# Patient Record
Sex: Male | Born: 1985 | Race: Black or African American | Hispanic: No | Marital: Single | Smoking: Current every day smoker
Health system: Southern US, Community
[De-identification: ages and names within clinical notes are randomized; demographics above are authoritative.]

## PROBLEM LIST (undated history)

## (undated) DIAGNOSIS — L309 Dermatitis, unspecified: Secondary | ICD-10-CM

## (undated) HISTORY — DX: Dermatitis, unspecified: L30.9

---
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
## 2019-01-20 ENCOUNTER — Encounter: Payer: Self-pay | Admitting: Family Medicine

## 2019-01-20 ENCOUNTER — Other Ambulatory Visit: Payer: Self-pay

## 2019-01-20 ENCOUNTER — Ambulatory Visit (INDEPENDENT_AMBULATORY_CARE_PROVIDER_SITE_OTHER): Payer: Self-pay | Admitting: Family Medicine

## 2019-01-20 ENCOUNTER — Encounter (INDEPENDENT_AMBULATORY_CARE_PROVIDER_SITE_OTHER): Payer: Self-pay

## 2019-01-20 VITALS — BP 138/98 | HR 85 | Temp 98.7°F | Resp 15 | Ht 74.0 in | Wt 237.1 lb

## 2019-01-20 DIAGNOSIS — I10 Essential (primary) hypertension: Secondary | ICD-10-CM

## 2019-01-20 DIAGNOSIS — E669 Obesity, unspecified: Secondary | ICD-10-CM

## 2019-01-20 DIAGNOSIS — Z1159 Encounter for screening for other viral diseases: Secondary | ICD-10-CM

## 2019-01-20 DIAGNOSIS — Z114 Encounter for screening for human immunodeficiency virus [HIV]: Secondary | ICD-10-CM

## 2019-01-20 DIAGNOSIS — Z833 Family history of diabetes mellitus: Secondary | ICD-10-CM

## 2019-01-20 DIAGNOSIS — Z9189 Other specified personal risk factors, not elsewhere classified: Secondary | ICD-10-CM

## 2019-01-20 DIAGNOSIS — L209 Atopic dermatitis, unspecified: Secondary | ICD-10-CM

## 2019-01-20 MED ORDER — TACROLIMUS 0.1 % EX OINT: TOPICAL_OINTMENT | Freq: Two times a day (BID) | CUTANEOUS | 0 refills | Status: DC

## 2019-01-20 MED ORDER — PROTOPIC 0.1 % EX OINT: TOPICAL_OINTMENT | Freq: Two times a day (BID) | CUTANEOUS | 0 refills | Status: DC

## 2019-01-20 NOTE — Progress Notes (Signed)
Subjective:     Patient ID: Vincent Arellano, male   DOB: [birth year], 33 y.o.   MRN: [id number]  Vincent Arellano presents for Establish Care and Eczema (has eczema on his arms and neck area that has been flaring up)  Vincent Arellano is a 33 year old male patient who presents today to establish care.  Has been in prison for the last 12 years.  Was released on April 14. Family history is consistent for diabetes, kidney disease, hypertension.  Reports that he is an every day smoker right now.  About a quarter of a pack of cigarettes.  Denies having any vape use.  Reports that he does smoke alcohol.  About 3-5 shots in the weekend of liquor.  Reports that he smokes marijuana about a month or so a week.  Reports that he is sexually active only uses condoms at the time.  Is not in a monogamous relationship at this time.  Is unsure of his current STI status.  Reports that he has had HIV testing before and was negative.  Would like to have STI testing again.  Denies having any signs or symptoms of STI at this time that he knows of.  Socially he lives with his mother and father.  They have no pets.  He has a daughter who is 312 But he is good with coparenting with the mother.  Enjoys music and chilling out and working out.  Reports that he eats meat but does not eat pork.  Does enjoy fruits and veggies.  Could eat a little bit better per his report.  Does report drinking red bull energy drinks occasionally throughout the week.  But not on a consistent basis.  He tries to drink about half a gallon or more of water daily.  Wears a seatbelt does not wear sunscreen.  Has smoke and carbon monoxide detectors in the home.  Does not use phone while driving.  Allergies to penicillin.  Only known medical history is eczema.  He reports that it started flaring up a couple weeks back.  At the end of his elbows and around his neck.  This is what he used to do when he was a young child.  He used to use a cream that he  does not remember the name of.  But would like to try to get on a cream again.  As he thinks this is unsightly and would like to get rid of it.  Today patient denies signs and symptoms of COVID 19 infection including fever, chills, cough, shortness of breath, and headache.  Past Medical, Surgical, Social History, Allergies, and Medications have been Reviewed.   Past Medical History:  Diagnosis Date  . Eczema    History reviewed. No pertinent surgical history. Social History   Socioeconomic History  . Marital status: Single    Spouse name: Not on file  . Number of children: 1  . Years of education: Not on file  . Highest education level: 9th grade  Occupational History  . Not on file  Social Needs  . Financial resource strain: Not hard at all  . Food insecurity    Worry: Never true    Inability: Never true  . Transportation needs    Medical: No    Non-medical: No  Tobacco Use  . Smoking status: Current Every Day Smoker    Packs/day: 0.25    Types: Cigarettes  . Smokeless tobacco: Never Used  Substance and Sexual Activity  .  Alcohol use: Yes    Comment: weekends 5 shots   . Drug use: Yes    Frequency: 1.0 times per week    Types: Marijuana  . Sexual activity: Yes    Birth control/protection: Condom  Lifestyle  . Physical activity    Days per week: 7 days    Minutes per session: 30 min  . Stress: Not at all  Relationships  . Social connections    Talks on phone: More than three times a week    Gets together: More than three times a week    Attends religious service: Never    Active member of club or organization: No    Attends meetings of clubs or organizations: Never    Relationship status: Never married  . Intimate partner violence    Fear of current or ex partner: No    Emotionally abused: No    Physically abused: No    Forced sexual activity: No  Other Topics Concern  . Not on file  Social History Narrative   Lives with mom and pops   No pets     Daughter 32 [address] -co-parenting with mom      Enjoy music, looking for work, chilling and hanging out      Diet: eats meat, no pork, fruits, veggies    Caffeine: red bull energy drinks occasionally   Water: 1/2 gallon or more daily       Wear seat belt   No sunscreen   Smoke and carbon monoxide detectors at home   Does not use phone while driving    No outpatient encounter medications on file as of 01/20/2019.   No facility-administered encounter medications on file as of 01/20/2019.    Allergies  Allergen Reactions  . Penicillins     Review of Systems  Constitutional: Negative for chills and fever.  HENT: Negative.   Eyes: Negative for visual disturbance.  Respiratory: Negative for cough and shortness of breath.   Gastrointestinal: Negative.   Genitourinary: Negative.   Musculoskeletal: Negative.   Skin: Positive for rash.  Neurological: Negative for dizziness and headaches.  Hematological: Negative.   Psychiatric/Behavioral: Negative.   All other systems reviewed and are negative.      Objective:     BP (!) 140/100 (BP Location: Left Arm, Patient Position: Sitting)   Pulse 85   Temp 98.7 F (37.1 C) (Temporal)   Resp 15   Ht 6\' 2"  (1.88 m)   Wt 237 lb 1.9 oz (107.6 kg)   SpO2 96%   BMI 30.44 kg/m   Physical Exam Vitals signs and nursing note reviewed.  Constitutional:      Appearance: Normal appearance.  HENT:     Head: Normocephalic and atraumatic.     Right Ear: External ear normal.     Left Ear: External ear normal.     Nose: Nose normal.  Eyes:     General:        Right eye: No discharge.        Left eye: No discharge.     Conjunctiva/sclera: Conjunctivae normal.  Neck:     Musculoskeletal: Normal range of motion and neck supple.  Cardiovascular:     Rate and Rhythm: Normal rate and regular rhythm.     Pulses: Normal pulses.     Heart sounds: Normal heart sounds.  Pulmonary:     Effort: Pulmonary effort is normal.     Breath sounds:  Normal breath sounds.  Musculoskeletal: Normal  range of motion.  Skin:    General: Skin is warm.     Capillary Refill: Capillary refill takes less than 2 seconds.     Comments: Neck and elbows    Neurological:     Mental Status: He is alert and oriented to person, place, and time.  Psychiatric:        Mood and Affect: Mood normal.        Behavior: Behavior normal.        Thought Content: Thought content normal.        Judgment: Judgment normal.        Assessment and Plan        1. Atopic dermatitis, unspecified type Had eczema has a child. Current rash is consistent with this. Will try Protopic.   Reviewed side effects, risks and benefits of medication.   Patient acknowledged agreement and understanding of the plan.   - tacrolimus (PROTOPIC) 0.1 % ointment; Apply topically 2 (two) times daily.  Dispense: 100 g; Refill: 0  2. Essential hypertension Needs baseline labs. Blood pressure is elevated. Would like 2 months to get it in back in control. Educated on lifestyle changes and diet. Pending the level that he is that when he returns in 2 months we will be deciding on whether or not he needs to go on a medication.  He is aware of this.  - CBC - COMPLETE METABOLIC PANEL WITH GFR  3. Encounter for hepatitis C virus screening test for high risk patient [place] task force recommendation  - HEP C AB W/REFL  4. Family history of diabetes mellitus  - Hemoglobin A1c  5. Encounter for screening for HIV [place] task force recommendation   - HIV Antibody (routine testing w rflx)  6. Obesity (BMI 30-39.9) Most likely BMI is due to muscle mass. Reports not eating the best. Will get baseline.    - Lipid panel   Follow Up: 2 months    Freddy FinnerHannah M. Traveion Ruddock, DNP, AGNP-BC [facility] [address], [address] [place], [place] [address] Office Hours: Mon-Thurs 8 am-5 pm; Fri 8 am-12 pm Office Phone:  [phone]  Office Fax: [phone]

## 2019-01-20 NOTE — Patient Instructions (Signed)
Thank you for coming into the office today. I appreciate the opportunity to provide you with the care for your health and wellness. Today we discussed: overall health  Follow Up: 2 months for blood pressure check  Labs on Friday morning (dont eat before you get them) I will let you know the results when they come in.  Sign up with MyChart  Avoid the bulk building pill until you know it does not cause blood pressure.  I sent in the cream for your skin. Take a directed, let me know if it does not work.  Work on reducing the about of salt you have in your diet.  Think about stopping smoking. One less cigarette a day.  Please continue to practice social distancing to keep you, your family, and our community safe.  If you must go out, please wear a Mask and practice good handwashing.  WASH YOUR HANDS WELL AND FREQUENTLY. AVOID TOUCHING YOUR FACE, UNLESS YOUR HANDS ARE FRESHLY WASHED.  GET FRESH AIR DAILY. STAY HYDRATED WITH WATER.   It was a pleasure to see you and I look forward to continuing to work together on your health and well-being. Please do not hesitate to call the office if you need care or have questions about your care.  Have a wonderful day and week.  With Gratitude,  Tereasa CoopHannah Audria Takeshita, DNP, AGNP-BC   Please think about quitting smoking.  This is very important for your health.  Consider setting a quit date, then cutting back or switching brands to prepare to stop.  Also think of the money you will save every day by not smoking.  Quick Tips to Quit Smoking:  Fix a date i.e. keep a date in mind from when you would not touch a tobacco product to smoke   Keep yourself busy and block your mind with work loads or reading books or watching movies in malls where smoking is not allowed   Vanish off the things which reminds you about smoking for example match box, or your favorite lighter, or the pipe you used for smoking, or your favorite jeans and shirt with which you  used to enjoy smoking, or the club where you used to do smoking   Try to avoid certain people places and incidences where and with whom smoking is a common factor to add on   Praise yourself with some token gifts from the money you saved by stopping smoking   Anti Smoking teams are there to help you. Join their programs   Anti-smoking Gums are there in many medical shops. Try them to quit smoking   Side-effects of Smoking:  Disease caused by smoking cigarettes are emphysema, bronchitis, heart failures   Premature death   Cancer is the major side effect of smoking   Heart attacks and strokes are the quick effects of smoking causing sudden death   Some smokers lives end up with limbs amputated   Breathing problem or fast breathing is another side effect of smoking   Due to more intakes of smokes, carbon mono-oxide goes into your brain and other muscles of the body which leads to swelling of the veins and blockage to the air passage to lungs   Carbon monoxide blocks blood vessels which leads to blockage in the flow of blood to different major body organs like heart lungs and thus leads to attacks and deaths   During pregnancy smoking is very harmful and leads to premature birth of the infant, spontaneous abortions,  low weight of the infant during birth   Fat depositions to narrow and blocked blood vessels causing heart attacks   In many cases cigarette smoking caused infertility in men    DASH Eating Plan DASH stands for "Dietary Approaches to Stop Hypertension." The DASH eating plan is a healthy eating plan that has been shown to reduce high blood pressure (hypertension). It may also reduce your risk for type 2 diabetes, heart disease, and stroke. The DASH eating plan may also help with weight loss. What are tips for following this plan?  General guidelines  Avoid eating more than 2,300 mg (milligrams) of salt (sodium) a day. If you have hypertension, you may need to reduce  your sodium intake to 1,500 mg a day.  Limit alcohol intake to no more than 1 drink a day for nonpregnant women and 2 drinks a day for men. One drink equals 12 oz of beer, 5 oz of wine, or 1 oz of hard liquor.  Work with your health care provider to maintain a healthy body weight or to lose weight. Ask what an ideal weight is for you.  Get at least 30 minutes of exercise that causes your heart to beat faster (aerobic exercise) most days of the week. Activities may include walking, swimming, or biking.  Work with your health care provider or diet and nutrition specialist (dietitian) to adjust your eating plan to your individual calorie needs. Reading food labels   Check food labels for the amount of sodium per serving. Choose foods with less than 5 percent of the Daily Value of sodium. Generally, foods with less than 300 mg of sodium per serving fit into this eating plan.  To find whole grains, look for the word "whole" as the first word in the ingredient list. Shopping  Buy products labeled as "low-sodium" or "no salt added."  Buy fresh foods. Avoid canned foods and premade or frozen meals. Cooking  Avoid adding salt when cooking. Use salt-free seasonings or herbs instead of table salt or sea salt. Check with your health care provider or pharmacist before using salt substitutes.  Do not fry foods. Cook foods using healthy methods such as baking, boiling, grilling, and broiling instead.  Cook with heart-healthy oils, such as olive, canola, soybean, or sunflower oil. Meal planning  Eat a balanced diet that includes: ? 5 or more servings of fruits and vegetables each day. At each meal, try to fill half of your plate with fruits and vegetables. ? Up to 6-8 servings of whole grains each day. ? Less than 6 oz of lean meat, poultry, or fish each day. A 3-oz serving of meat is about the same size as a deck of cards. One egg equals 1 oz. ? 2 servings of low-fat dairy each day. ? A serving  of nuts, seeds, or beans 5 times each week. ? Heart-healthy fats. Healthy fats called Omega-3 fatty acids are found in foods such as flaxseeds and coldwater fish, like sardines, salmon, and mackerel.  Limit how much you eat of the following: ? Canned or prepackaged foods. ? Food that is high in trans fat, such as fried foods. ? Food that is high in saturated fat, such as fatty meat. ? Sweets, desserts, sugary drinks, and other foods with added sugar. ? Full-fat dairy products.  Do not salt foods before eating.  Try to eat at least 2 vegetarian meals each week.  Eat more home-cooked food and less restaurant, buffet, and fast food.  When eating  at a restaurant, ask that your food be prepared with less salt or no salt, if possible. What foods are recommended? The items listed may not be a complete list. Talk with your dietitian about what dietary choices are best for you. Grains Whole-grain or whole-wheat bread. Whole-grain or whole-wheat pasta. Brown rice. Orpah Cobbatmeal. Quinoa. Bulgur. Whole-grain and low-sodium cereals. Pita bread. Low-fat, low-sodium crackers. Whole-wheat flour tortillas. Vegetables Fresh or frozen vegetables (raw, steamed, roasted, or grilled). Low-sodium or reduced-sodium tomato and vegetable juice. Low-sodium or reduced-sodium tomato sauce and tomato paste. Low-sodium or reduced-sodium canned vegetables. Fruits All fresh, dried, or frozen fruit. Canned fruit in natural juice (without added sugar). Meat and other protein foods Skinless chicken or Malawiturkey. Ground chicken or Mal[place]iturkey. Pork with fat trimmed off. Fish and seafood. Egg whites. Dried beans, peas, or lentils. Unsalted nuts, nut butters, and seeds. Unsalted canned beans. Lean cuts of beef with fat trimmed off. Low-sodium, lean deli meat. Dairy Low-fat (1%) or fat-free (skim) milk. Fat-free, low-fat, or reduced-fat cheeses. Nonfat, low-sodium ricotta or cottage cheese. Low-fat or nonfat yogurt. Low-fat, low-sodium  cheese. Fats and oils Soft margarine without trans fats. Vegetable oil. Low-fat, reduced-fat, or light mayonnaise and salad dressings (reduced-sodium). Canola, safflower, olive, soybean, and sunflower oils. Avocado. Seasoning and other foods Herbs. Spices. Seasoning mixes without salt. Unsalted popcorn and pretzels. Fat-free sweets. What foods are not recommended? The items listed may not be a complete list. Talk with your dietitian about what dietary choices are best for you. Grains Baked goods made with fat, such as croissants, muffins, or some breads. Dry pasta or rice meal packs. Vegetables Creamed or fried vegetables. Vegetables in a cheese sauce. Regular canned vegetables (not low-sodium or reduced-sodium). Regular canned tomato sauce and paste (not low-sodium or reduced-sodium). Regular tomato and vegetable juice (not low-sodium or reduced-sodium). Rosita FirePickles. Olives. Fruits Canned fruit in a light or heavy syrup. Fried fruit. Fruit in cream or butter sauce. Meat and other protein foods Fatty cuts of meat. Ribs. Fried meat. Tomasa BlaseBacon. Sausage. Bologna and other processed lunch meats. Salami. Fatback. Hotdogs. Bratwurst. Salted nuts and seeds. Canned beans with added salt. Canned or smoked fish. Whole eggs or egg yolks. Chicken or Malawiturkey with skin. Dairy Whole or 2% milk, cream, and half-and-half. Whole or full-fat cream cheese. Whole-fat or sweetened yogurt. Full-fat cheese. Nondairy creamers. Whipped toppings. Processed cheese and cheese spreads. Fats and oils Butter. Stick margarine. Lard. Shortening. Ghee. Bacon fat. Tropical oils, such as coconut, palm kernel, or palm oil. Seasoning and other foods Salted popcorn and pretzels. Onion salt, garlic salt, seasoned salt, table salt, and sea salt. Worcestershire sauce. Tartar sauce. Barbecue sauce. Teriyaki sauce. Soy sauce, including reduced-sodium. Steak sauce. Canned and packaged gravies. Fish sauce. Oyster sauce. Cocktail sauce. Horseradish  that you find on the shelf. Ketchup. Mustard. Meat flavorings and tenderizers. Bouillon cubes. Hot sauce and Tabasco sauce. Premade or packaged marinades. Premade or packaged taco seasonings. Relishes. Regular salad dressings. Where to find more information:  National Heart, Lung, and Blood Institute: PopSteam.iswww.nhlbi.nih.gov  American Heart Association: www.heart.org Summary  The DASH eating plan is a healthy eating plan that has been shown to reduce high blood pressure (hypertension). It may also reduce your risk for type 2 diabetes, heart disease, and stroke.  With the DASH eating plan, you should limit salt (sodium) intake to 2,300 mg a day. If you have hypertension, you may need to reduce your sodium intake to 1,500 mg a day.  When on the DASH eating plan, aim to  eat more fresh fruits and vegetables, whole grains, lean proteins, low-fat dairy, and heart-healthy fats.  Work with your health care provider or diet and nutrition specialist (dietitian) to adjust your eating plan to your individual calorie needs. This information is not intended to replace advice given to you by your health care provider. Make sure you discuss any questions you have with your health care provider. Document Released: 06/13/2011 Document Revised: 06/06/2017 Document Reviewed: 06/17/2016 Elsevier Patient Education  2020 [employer].

## 2019-01-22 ENCOUNTER — Telehealth: Payer: Self-pay | Admitting: *Deleted

## 2019-01-22 NOTE — Telephone Encounter (Signed)
Do you want to send anything else

## 2019-01-22 NOTE — Telephone Encounter (Signed)
Pts mother called said the cream that Jarrett Soho prescribed the insurance wont cover. The first one was 700.00 the second was 200.00 and they cannot afford this. Wondering if there was a different kind of cream or if they should just keep trying different pharmacies. Would like a call back.

## 2019-01-25 ENCOUNTER — Encounter: Payer: Self-pay | Admitting: Family Medicine

## 2019-01-27 MED ORDER — ELIDEL 1 % EX CREA: TOPICAL_CREAM | Freq: Two times a day (BID) | CUTANEOUS | 1 refills | Status: DC

## 2019-01-27 NOTE — Telephone Encounter (Signed)
Left message on mothers voicemail that to let us know if the OTC cream wasn't helping and we would call in elidel to see if it was affordable and to call back to let us know

## 2019-01-27 NOTE — Telephone Encounter (Signed)
elidel called in and patient aware and call back if too expensive

## 2019-01-27 NOTE — Telephone Encounter (Signed)
The only other preferred med is brand elidel. Want to try that or send pt info on patient assistance for protopic?

## 2019-01-27 NOTE — Addendum Note (Signed)
Addended by: Eual Fines on: 01/27/2019 03:22 PM   Modules accepted: Orders

## 2019-03-23 ENCOUNTER — Ambulatory Visit: Payer: Medicaid Other | Admitting: Family Medicine

## 2019-04-05 ENCOUNTER — Ambulatory Visit: Payer: Medicaid Other

## 2019-05-17 ENCOUNTER — Ambulatory Visit: Payer: Medicaid Other

## 2019-06-09 ENCOUNTER — Ambulatory Visit: Payer: Medicaid Other

## 2019-10-06 ENCOUNTER — Emergency Department (HOSPITAL_COMMUNITY): Payer: Self-pay

## 2019-10-06 ENCOUNTER — Encounter (HOSPITAL_COMMUNITY): Payer: Self-pay | Admitting: Emergency Medicine

## 2019-10-06 ENCOUNTER — Other Ambulatory Visit: Payer: Self-pay

## 2019-10-06 ENCOUNTER — Emergency Department (HOSPITAL_COMMUNITY)
Admission: EM | Admit: 2019-10-06 | Discharge: 2019-10-06 | Disposition: A | Discharge status: Home / Self Care | Payer: Self-pay | Attending: Emergency Medicine | Admitting: Emergency Medicine

## 2019-10-06 DIAGNOSIS — Y939 Activity, unspecified: Secondary | ICD-10-CM | POA: Insufficient documentation

## 2019-10-06 DIAGNOSIS — S01312A Laceration without foreign body of left ear, initial encounter: Secondary | ICD-10-CM | POA: Insufficient documentation

## 2019-10-06 DIAGNOSIS — Y999 Unspecified external cause status: Secondary | ICD-10-CM | POA: Insufficient documentation

## 2019-10-06 DIAGNOSIS — Y929 Unspecified place or not applicable: Secondary | ICD-10-CM | POA: Insufficient documentation

## 2019-10-06 DIAGNOSIS — S0990XA Unspecified injury of head, initial encounter: Secondary | ICD-10-CM | POA: Insufficient documentation

## 2019-10-06 MED ORDER — IBUPROFEN 800 MG PO TABS: 800.0000 mg | ORAL_TABLET | Freq: Three times a day (TID) | ORAL | 0 refills | Status: AC | PRN

## 2019-10-06 MED ORDER — LIDOCAINE HCL (PF) 1 % IJ SOLN
INTRAMUSCULAR | Status: AC
  Filled 2019-10-06: qty 30

## 2019-10-06 MED ORDER — POVIDONE-IODINE 10 % EX SOLN
CUTANEOUS | Status: AC
  Filled 2019-10-06: qty 15

## 2019-10-06 NOTE — Discharge Instructions (Addendum)
Follow-up with Dr. Romeo Apple to recheck your knee if any problems.  You need the sutures out in 1 week.  Just clean the area gently with soap and water twice a day.  Follow-up with Dr. Ezzard Standing to check your ear in a week and if you cannot get into see him just come back care here
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
## 2021-04-17 ENCOUNTER — Encounter: Payer: Self-pay | Admitting: Emergency Medicine

## 2021-04-17 ENCOUNTER — Other Ambulatory Visit: Payer: Self-pay

## 2021-04-17 ENCOUNTER — Ambulatory Visit
Admission: EM | Admit: 2021-04-17 | Discharge: 2021-04-17 | Disposition: A | Discharge status: Home / Self Care | Payer: No Typology Code available for payment source | Attending: Internal Medicine | Admitting: Internal Medicine

## 2021-04-17 DIAGNOSIS — R21 Rash and other nonspecific skin eruption: Secondary | ICD-10-CM

## 2021-04-17 MED ORDER — HYDROXYZINE HCL 25 MG PO TABS: 25.0000 mg | ORAL_TABLET | Freq: Three times a day (TID) | ORAL | 0 refills | Status: AC | PRN

## 2021-04-17 MED ORDER — TRIAMCINOLONE ACETONIDE 0.1 % EX CREA: 1.0000 "application " | TOPICAL_CREAM | Freq: Two times a day (BID) | CUTANEOUS | 0 refills | Status: AC

## 2021-04-17 NOTE — Discharge Instructions (Signed)
Please use medications as prescribed If symptoms worsen please return to urgent care to be reevaluated We will call you with recommendations if labs are abnormal Abstain from sexual intercourse until the lab results are available.
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
## 2021-04-17 NOTE — ED Triage Notes (Signed)
Rash on both palms and bottom of feet.  That itches and burns.  Started last night.

## 2021-04-18 LAB — RPR: RPR Ser Ql: NONREACTIVE
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
## 2024-05-27 ENCOUNTER — Emergency Department (HOSPITAL_COMMUNITY)
Admission: EM | Admit: 2024-05-27 | Discharge: 2024-05-27 | Disposition: A | Discharge status: Home / Self Care | Attending: Emergency Medicine | Admitting: Emergency Medicine

## 2024-05-27 ENCOUNTER — Other Ambulatory Visit: Payer: Self-pay

## 2024-05-27 DIAGNOSIS — L309 Dermatitis, unspecified: Secondary | ICD-10-CM | POA: Diagnosis not present

## 2024-05-27 DIAGNOSIS — R21 Rash and other nonspecific skin eruption: Secondary | ICD-10-CM | POA: Diagnosis present

## 2024-05-27 DIAGNOSIS — Z202 Contact with and (suspected) exposure to infections with a predominantly sexual mode of transmission: Secondary | ICD-10-CM

## 2024-05-27 MED ORDER — PREDNISONE 50 MG PO TABS
60.0000 mg | ORAL_TABLET | Freq: Once | ORAL | Status: AC
  Administered 2024-05-27: 60 mg via ORAL
  Filled 2024-05-27: qty 1

## 2024-05-27 MED ORDER — NYSTATIN-TRIAMCINOLONE 100000-0.1 UNIT/GM-% EX CREA: 1.0000 | TOPICAL_CREAM | Freq: Two times a day (BID) | CUTANEOUS | 0 refills | Status: AC

## 2024-05-27 MED ORDER — PREDNISONE 10 MG PO TABS: ORAL_TABLET | ORAL | 0 refills | Status: AC

## 2024-05-27 MED ORDER — NYSTATIN-TRIAMCINOLONE 100000-0.1 UNIT/GM-% EX CREA: 1.0000 | TOPICAL_CREAM | Freq: Two times a day (BID) | CUTANEOUS | 0 refills | Status: DC

## 2024-05-27 NOTE — Discharge Instructions (Addendum)
 You are being prescribed 2 medications which should help you significantly with your rash.  I suspect this is a flare of your eczema.  Take the next dose of the prednisone tablets tomorrow, this is a tapered medicine meaning you will take a higher dose tomorrow than you will on the following days, follow the label instructions.  You can take the days dose all at once, but make sure you are taking it around the same time each day.  I also want you to rub in the hand cream prescribed, this will help with the inflammation but also will cover you in case there is a fungal infection component to this rash.  I do recommend follow-up with dermatology if this plan is not resolving your rash.  You have also been screened for STDs.  Remember that bacterial vaginosis which is what your girlfriend was diagnosed with is not an STD and you do not need to be treated for this kind of infection.

## 2024-05-27 NOTE — ED Triage Notes (Addendum)
 Pt complains of dry irritated skin on hands x 2 months. Pt was seen a few months ago at PCP and given steroid and a cream with no improvement. Pt states noticed it started a few months ago after using a new soap and immediately stopped but no improvement in skin condition. Has hx of eczema. Pt states legs will get dry but not as bad as hands. Pt states it will itch so mad at night that it will begin to bleed.   Pt states he was with a girl who was recently diagnosed with STD and placed on antibiotics. Pt would like to be tested.

## 2024-05-27 NOTE — ED Provider Notes (Signed)
 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  He states he generally always uses Starla but about 2 months ago he switched to [place] which he suspects flared his eczema.  He has since returned to his Starla and also had a course of a steroid cream with no improvement in his symptoms. He denies fevers or chills. He reports generalized skin dryness but no significant rash other than on his hands.  He also would like to be screened for STDs.  He had a unprotected encounter with a new partner who advised him she has been diagnosed with bacterial vaginosis.  Discussed with patient that this is not an STD, but we will screen him for STDs since he is here.  He denies penile discharge, fevers chills, penile rash or pain.  No dysuria. Declines HIV but would like syphilis testing.   The history is provided by the patient.       Prior to Admission medications   Medication Sig Start Date End Date Taking? Authorizing Provider  nystatin -triamcinolone  (MYCOLOG II) cream Apply 1 Application topically 2 (two) times daily. Apply to rash on hands. 05/27/24  Yes Vincent Arellano, Mliss, PA-C  predniSONE  (DELTASONE ) 10 MG tablet 6, 5, 4, 3, 2 then 1 tablet by mouth daily for 6 days total. 05/27/24  Yes Vincent Channell, PA-C  hydrOXYzine  (ATARAX /VISTARIL ) 25 MG tablet Take 1 tablet (25 mg total) by mouth every 8 (eight) hours as needed for itching. 04/17/21   LampteyAleene KIDD, MD  ibuprofen  (ADVIL ) 800 MG tablet Take 1 tablet (800 mg total) by mouth every 8 (eight) hours as needed for moderate pain. 10/06/19   Vincent Pac, MD    Allergies: Penicillins    Review of Systems  Constitutional:   Negative for fever.  HENT:  Negative for congestion and sore throat.   Eyes: Negative.   Respiratory:  Negative for chest tightness and shortness of breath.   Cardiovascular:  Negative for chest pain.  Gastrointestinal:  Negative for abdominal pain and nausea.  Genitourinary: Negative.   Musculoskeletal:  Negative for arthralgias, joint swelling and neck pain.  Skin:  Positive for rash. Negative for wound.  Neurological:  Negative for dizziness, weakness, light-headedness, numbness and headaches.  Psychiatric/Behavioral: Negative.      Updated Vital Signs BP (!) 137/100   Pulse 79   Temp 98.7 F (37.1 C) (Oral)   Resp 18   Ht 6' 2 (1.88 m)   Wt 102.1 kg   SpO2 98%   BMI 28.89 kg/m   Physical Exam Vitals and nursing note reviewed.  Constitutional:      Appearance: He is well-developed.  HENT:     Head: Normocephalic and atraumatic.  Eyes:     Conjunctiva/sclera: Conjunctivae normal.  Cardiovascular:     Rate and Rhythm: Normal rate and regular rhythm.     Heart sounds: Normal heart sounds.  Pulmonary:     Effort: Pulmonary effort is normal.     Breath sounds: Normal breath sounds. No wheezing.  Abdominal:     General: Bowel sounds are normal.     Palpations: Abdomen is soft.     Tenderness: There is  no abdominal tenderness.  Musculoskeletal:        General: Normal range of motion.     Cervical back: Normal range of motion.  Skin:    General: Skin is warm and dry.     Findings: Rash present. Rash is crusting and scaling.     Comments: Dry scaling rash bilateral radial volar hands.  There are several areas of deep fissures.  No bleeding, no vesicles or pustules.  Dorsal hands are very dry the rash is mostly limited to the volar surfaces.  Neurological:     Mental Status: He is alert.     (all labs ordered are listed, but only abnormal results are displayed) Labs Reviewed  RPR  GC/CHLAMYDIA PROBE AMP ([place]) NOT AT [facility]    EKG: None  Radiology: No  results found.   Procedures   Medications Ordered in the ED  predniSONE  (DELTASONE ) tablet 60 mg (has no administration in time range)                                    Medical Decision Making Patient presenting with fairly severe rash on his volar hands which appear to be eczema exacerbation.  There is no erythema or drainage, no signs of cellulitis or bacterial skin infection.  There are some deep fissures and scaling, patient describes intense pruritus, concern for possible fungal component.  He is placed on Mycolog to cream to cover him for that possibility and he is also given oral steroids as well.  He was encouraged to follow-up with dermatology given a referral for this.  Amount and/or Complexity of Data Reviewed Labs: ordered.  Risk Prescription drug management.        Final diagnoses:  Eczema, unspecified type    ED Discharge Orders          Ordered    nystatin -triamcinolone  (MYCOLOG II) cream  2 times daily        05/27/24 1146    predniSONE  (DELTASONE ) 10 MG tablet        05/27/24 1146               Vincent Robben, PA-C 05/29/24 2145    Vincent Ozell BROCKS, MD 06/03/24 1450

## 2024-05-28 LAB — GC/CHLAMYDIA PROBE AMP (~~LOC~~) NOT AT ARMC
Chlamydia: NEGATIVE
Comment: NEGATIVE
Comment: NORMAL
Neisseria Gonorrhea: NEGATIVE

## 2024-05-28 LAB — SYPHILIS: RPR W/REFLEX TO RPR TITER AND TREPONEMAL ANTIBODIES, TRADITIONAL SCREENING AND DIAGNOSIS ALGORITHM: RPR Ser Ql: NONREACTIVE
